# Patient Record
Sex: Male | Born: 1985 | Race: Black or African American | Hispanic: No | Marital: Single | Smoking: Current every day smoker
Health system: Southern US, Community
[De-identification: ages and names within clinical notes are randomized; demographics above are authoritative.]

## PROBLEM LIST (undated history)

## (undated) DIAGNOSIS — I10 Essential (primary) hypertension: Secondary | ICD-10-CM

## (undated) HISTORY — DX: Essential (primary) hypertension: I10

---
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
## 2004-10-22 ENCOUNTER — Emergency Department (HOSPITAL_COMMUNITY): Admission: EM | Admit: 2004-10-22 | Discharge: 2004-10-22 | Payer: Self-pay | Admitting: Emergency Medicine

## 2006-06-15 ENCOUNTER — Emergency Department (HOSPITAL_COMMUNITY): Admission: EM | Admit: 2006-06-15 | Discharge: 2006-06-15 | Payer: Self-pay | Admitting: Emergency Medicine

## 2015-10-06 ENCOUNTER — Emergency Department (HOSPITAL_COMMUNITY)
Admission: EM | Admit: 2015-10-06 | Discharge: 2015-10-07 | Disposition: A | Discharge status: Home / Self Care | Payer: BLUE CROSS/BLUE SHIELD | Attending: Emergency Medicine | Admitting: Emergency Medicine

## 2015-10-06 ENCOUNTER — Encounter (HOSPITAL_COMMUNITY): Payer: Self-pay | Admitting: Emergency Medicine

## 2015-10-06 DIAGNOSIS — F17210 Nicotine dependence, cigarettes, uncomplicated: Secondary | ICD-10-CM | POA: Insufficient documentation

## 2015-10-06 DIAGNOSIS — N200 Calculus of kidney: Secondary | ICD-10-CM | POA: Insufficient documentation

## 2015-10-06 MED ORDER — MORPHINE SULFATE (PF) 4 MG/ML IV SOLN
4.0000 mg | Freq: Once | INTRAVENOUS | Status: AC
  Administered 2015-10-06: 4 mg via INTRAVENOUS
  Filled 2015-10-06: qty 1

## 2015-10-06 MED ORDER — SODIUM CHLORIDE 0.9 % IV BOLUS (SEPSIS)
1000.0000 mL | Freq: Once | INTRAVENOUS | Status: AC
  Administered 2015-10-06: 1000 mL via INTRAVENOUS

## 2015-10-06 MED ORDER — ONDANSETRON HCL 4 MG/2ML IJ SOLN
4.0000 mg | Freq: Once | INTRAMUSCULAR | Status: AC
  Administered 2015-10-06: 4 mg via INTRAVENOUS
  Filled 2015-10-06: qty 2

## 2015-10-06 NOTE — ED Notes (Signed)
Patient has right flank pain that travels around to abdomen, started about 2 hours ago.

## 2015-10-06 NOTE — ED Provider Notes (Signed)
CSN: [id number]     Arrival date & time 10/06/15  2244 History   First MD Initiated Contact with Patient 10/06/15 2249     Chief Complaint  Patient presents with  . Flank Pain  . Abdominal Pain     (Consider location/radiation/quality/duration/timing/severity/associated sxs/prior Treatment) HPI   Norwood LevoJeffrey A Offutt is a 30 y.o. male who presents to the Emergency Department complaining of sudden onset of right flank pain approximately two hours prior to ED arrival.  He describes a sharp pain to the flank that radiates into his mid abdomen.  Pain began after eating and voiding.  Nothing has made the pain better or worse.  He denies fever, dysuria, vomiting, diarrhea, recent illness or hx of kidney stones.     History reviewed. No pertinent past medical history. History reviewed. No pertinent past surgical history. Family History  Problem Relation Age of Onset  . Cancer Father    Social History  Substance Use Topics  . Smoking status: Current Every Day Smoker -- 1.00 packs/day for 10 years    Types: Cigarettes  . Smokeless tobacco: Never Used  . Alcohol Use: Yes     Comment: Ocassional    Review of Systems  Constitutional: Negative for fever, chills and appetite change.  HENT: Negative for sore throat.   Respiratory: Negative for chest tightness and shortness of breath.   Gastrointestinal: Positive for nausea and abdominal pain. Negative for vomiting, diarrhea and constipation.  Genitourinary: Negative for dysuria, frequency, hematuria, discharge, difficulty urinating, penile pain and testicular pain.  Musculoskeletal: Positive for back pain. Negative for neck pain.  Skin: Negative for rash.  Neurological: Negative for dizziness, weakness and numbness.  All other systems reviewed and are negative.     Allergies  Review of patient's allergies indicates not on file.  Home Medications   Prior to Admission medications   Not on File   BP 152/98 mmHg  Pulse 54  Temp(Src)  98.5 F (36.9 C) (Oral)  Resp 16  Ht 5\' 9"  (1.753 m)  Wt 74.844 kg  BMI 24.36 kg/m2  SpO2 100% Physical Exam  Constitutional: He is oriented to person, place, and time. He appears well-developed and well-nourished. No distress.  HENT:  Head: Atraumatic.  Mouth/Throat: Oropharynx is clear and moist.  Cardiovascular: Normal rate and regular rhythm.   Pulmonary/Chest: Effort normal and breath sounds normal.  Abdominal: Soft. Normal appearance. He exhibits no distension. There is no tenderness. There is no rebound, no guarding and no CVA tenderness.  Pt indicates pain to the right flank area.  No CVA tenderness on exam.  Musculoskeletal: Normal range of motion.  Neurological: He is alert and oriented to person, place, and time.  Skin: Skin is warm. No rash noted.  Psychiatric: He has a normal mood and affect.  Nursing note and vitals reviewed.   ED Course  Procedures (including critical care time) Labs Review Labs Reviewed  URINALYSIS, ROUTINE W REFLEX MICROSCOPIC (NOT [facility]) - Abnormal; Notable for the following:    pH 8.5 (*)    Hgb urine dipstick MODERATE (*)    Protein, ur 30 (*)    All other components within normal limits  CBC WITH DIFFERENTIAL/PLATELET - Abnormal; Notable for the following:    WBC 14.3 (*)    Neutro Abs 11.9 (*)    All other components within normal limits  COMPREHENSIVE METABOLIC PANEL - Abnormal; Notable for the following:    Potassium 3.2 (*)    Glucose, Bld 123 (*)  Total Protein 8.4 (*)    All other components within normal limits  URINE MICROSCOPIC-ADD ON - Abnormal; Notable for the following:    Squamous Epithelial / LPF 0-5 (*)    All other components within normal limits  URINE CULTURE  LIPASE, BLOOD    Imaging Review Ct Renal Stone Study  10/07/2015  CLINICAL DATA:  Acute onset of right flank pain.  Initial encounter. EXAM: CT ABDOMEN AND PELVIS WITHOUT CONTRAST TECHNIQUE: Multidetector CT imaging of the abdomen and pelvis was  performed following the standard protocol without IV contrast. COMPARISON:  None. FINDINGS: The visualized lung bases are clear. The liver and spleen are unremarkable in appearance. The gallbladder is within normal limits. The pancreas and adrenal glands are unremarkable. Minimal right-sided hydronephrosis is noted, with right-sided perinephric stranding. There appears to be an obstructing 3 mm stone at the distal right ureter, 1 cm above the right vesicoureteral junction. No free fluid is identified. The small bowel is unremarkable in appearance. The stomach is within normal limits. No acute vascular abnormalities are seen. The appendix is normal in caliber, without evidence of appendicitis. The colon is unremarkable in appearance. The bladder is relatively decompressed and not well assessed. The prostate remains normal in size. No inguinal lymphadenopathy is seen. No acute osseous abnormalities are identified. IMPRESSION: Minimal right-sided hydronephrosis, with an obstructing 3 mm stone at the distal right ureter, 1 cm above the right vesicoureteral junction. Electronically Signed   By: Roanna Raider M.D.   On: 10/07/2015 00:39   I have personally reviewed and evaluated these images and lab results as part of my medical decision-making.   EKG Interpretation None      MDM   Final diagnoses:  Right kidney stone    Pt is well appearing.  Vitals stable.  Sudden onset of right flank pain this evening.  CT shows 3mm obstructing stone.  No fever, dysuria, vomiting or CVA tenderness.   Pt feeling better after pain medication and anti-emetic.  Urine strainer dispensed, rx for flomax, percocet and zofran.  Referral info given for urology.  Agrees to ER return if needed.   Pauline Aus, PA-C 10/07/15 1841  Bethann Berkshire, MD 10/08/15 1650

## 2015-10-07 ENCOUNTER — Emergency Department (HOSPITAL_COMMUNITY): Payer: BLUE CROSS/BLUE SHIELD

## 2015-10-07 LAB — CBC WITH DIFFERENTIAL/PLATELET
Basophils Absolute: 0 10*3/uL (ref 0.0–0.1)
Basophils Relative: 0 %
EOS PCT: 0 %
Eosinophils Absolute: 0.1 10*3/uL (ref 0.0–0.7)
HEMATOCRIT: 43.4 % (ref 39.0–52.0)
HEMOGLOBIN: 15.1 g/dL (ref 13.0–17.0)
LYMPHS ABS: 1.5 10*3/uL (ref 0.7–4.0)
LYMPHS PCT: 11 %
MCH: 33.2 pg (ref 26.0–34.0)
MCHC: 34.8 g/dL (ref 30.0–36.0)
MCV: 95.4 fL (ref 78.0–100.0)
Monocytes Absolute: 0.9 10*3/uL (ref 0.1–1.0)
Monocytes Relative: 6 %
NEUTROS ABS: 11.9 10*3/uL — AB (ref 1.7–7.7)
NEUTROS PCT: 83 %
Platelets: 163 10*3/uL (ref 150–400)
RBC: 4.55 MIL/uL (ref 4.22–5.81)
RDW: 12.1 % (ref 11.5–15.5)
WBC: 14.3 10*3/uL — AB (ref 4.0–10.5)

## 2015-10-07 LAB — URINALYSIS, ROUTINE W REFLEX MICROSCOPIC
BILIRUBIN URINE: NEGATIVE
Glucose, UA: NEGATIVE mg/dL
Ketones, ur: NEGATIVE mg/dL
Leukocytes, UA: NEGATIVE
Nitrite: NEGATIVE
Protein, ur: 30 mg/dL — AB
SPECIFIC GRAVITY, URINE: 1.015 (ref 1.005–1.030)
pH: 8.5 — ABNORMAL HIGH (ref 5.0–8.0)

## 2015-10-07 LAB — COMPREHENSIVE METABOLIC PANEL
ALK PHOS: 60 U/L (ref 38–126)
ALT: 33 U/L (ref 17–63)
AST: 33 U/L (ref 15–41)
Albumin: 5 g/dL (ref 3.5–5.0)
Anion gap: 9 (ref 5–15)
BILIRUBIN TOTAL: 0.9 mg/dL (ref 0.3–1.2)
BUN: 16 mg/dL (ref 6–20)
CALCIUM: 9.4 mg/dL (ref 8.9–10.3)
CO2: 25 mmol/L (ref 22–32)
CREATININE: 1.03 mg/dL (ref 0.61–1.24)
Chloride: 105 mmol/L (ref 101–111)
Glucose, Bld: 123 mg/dL — ABNORMAL HIGH (ref 65–99)
Potassium: 3.2 mmol/L — ABNORMAL LOW (ref 3.5–5.1)
Sodium: 139 mmol/L (ref 135–145)
Total Protein: 8.4 g/dL — ABNORMAL HIGH (ref 6.5–8.1)

## 2015-10-07 LAB — LIPASE, BLOOD: LIPASE: 37 U/L (ref 11–51)

## 2015-10-07 LAB — URINE MICROSCOPIC-ADD ON
Bacteria, UA: NONE SEEN
WBC, UA: NONE SEEN WBC/hpf (ref 0–5)

## 2015-10-07 MED ORDER — TAMSULOSIN HCL 0.4 MG PO CAPS: 0.4000 mg | ORAL_CAPSULE | Freq: Every day | ORAL | Status: DC

## 2015-10-07 MED ORDER — ONDANSETRON HCL 4 MG PO TABS: 4.0000 mg | ORAL_TABLET | Freq: Four times a day (QID) | ORAL | Status: DC

## 2015-10-07 MED ORDER — OXYCODONE-ACETAMINOPHEN 5-325 MG PO TABS: 1.0000 | ORAL_TABLET | ORAL | Status: DC | PRN

## 2015-10-07 MED ORDER — KETOROLAC TROMETHAMINE 30 MG/ML IJ SOLN
30.0000 mg | Freq: Once | INTRAMUSCULAR | Status: AC
  Administered 2015-10-07: 30 mg via INTRAVENOUS
  Filled 2015-10-07: qty 1

## 2015-10-07 NOTE — ED Notes (Signed)
Patient was given a prepackage of Percocet 5-325 mg quantity six and given instruction on use, patient verbally understands.

## 2015-10-07 NOTE — Discharge Instructions (Signed)
Kidney Stones °Kidney stones (urolithiasis) are deposits that form inside your kidneys. The intense pain is caused by the stone moving through the urinary tract. When the stone moves, the ureter goes into spasm around the stone. The stone is usually passed in the urine.  °CAUSES  °· A disorder that makes certain neck glands produce too much parathyroid hormone (primary hyperparathyroidism). °· A buildup of uric acid crystals, similar to gout in your joints. °· Narrowing (stricture) of the ureter. °· A kidney obstruction present at birth (congenital obstruction). °· Previous surgery on the kidney or ureters. °· Numerous kidney infections. °SYMPTOMS  °· Feeling sick to your stomach (nauseous). °· Throwing up (vomiting). °· Blood in the urine (hematuria). °· Pain that usually spreads (radiates) to the groin. °· Frequency or urgency of urination. °DIAGNOSIS  °· Taking a history and physical exam. °· Blood or urine tests. °· CT scan. °· Occasionally, an examination of the inside of the urinary bladder (cystoscopy) is performed. °TREATMENT  °· Observation. °· Increasing your fluid intake. °· Extracorporeal shock wave lithotripsy--This is a noninvasive procedure that uses shock waves to break up kidney stones. °· Surgery may be needed if you have severe pain or persistent obstruction. There are various surgical procedures. Most of the procedures are performed with the use of small instruments. Only small incisions are needed to accommodate these instruments, so recovery time is minimized. °The size, location, and chemical composition are all important variables that will determine the proper choice of action for you. Talk to your health care provider to better understand your situation so that you will minimize the risk of injury to yourself and your kidney.  °HOME CARE INSTRUCTIONS  °· Drink enough water and fluids to keep your urine clear or pale yellow. This will help you to pass the stone or stone fragments. °· Strain  all urine through the provided strainer. Keep all particulate matter and stones for your health care provider to see. The stone causing the pain may be as small as a grain of salt. It is very important to use the strainer each and every time you pass your urine. The collection of your stone will allow your health care provider to analyze it and verify that a stone has actually passed. The stone analysis will often identify what you can do to reduce the incidence of recurrences. °· Only take over-the-counter or prescription medicines for pain, discomfort, or fever as directed by your health care provider. °· Keep all follow-up visits as told by your health care provider. This is important. °· Get follow-up X-rays if required. The absence of pain does not always mean that the stone has passed. It may have only stopped moving. If the urine remains completely obstructed, it can cause loss of kidney function or even complete destruction of the kidney. It is your responsibility to make sure X-rays and follow-ups are completed. Ultrasounds of the kidney can show blockages and the status of the kidney. Ultrasounds are not associated with any radiation and can be performed easily in a matter of minutes. °· Make changes to your daily diet as told by your health care provider. You may be told to: °¨ Limit the amount of salt that you eat. °¨ Eat 5 or more servings of fruits and vegetables each day. °¨ Limit the amount of meat, poultry, fish, and eggs that you eat. °· Collect a 24-hour urine sample as told by your health care provider. You may need to collect another urine sample every 6-12   months. °SEEK MEDICAL CARE IF: °· You experience pain that is progressive and unresponsive to any pain medicine you have been prescribed. °SEEK IMMEDIATE MEDICAL CARE IF:  °· Pain cannot be controlled with the prescribed medicine. °· You have a fever or shaking chills. °· The severity or intensity of pain increases over 18 hours and is not  relieved by pain medicine. °· You develop a new onset of abdominal pain. °· You feel faint or pass out. °· You are unable to urinate. °  °This information is not intended to replace advice given to you by your health care provider. Make sure you discuss any questions you have with your health care provider. °  °Document Released: 06/15/2005 Document Revised: 03/06/2015 Document Reviewed: 11/16/2012 °Elsevier Interactive Patient Education ©2016 [employer] ° °

## 2015-10-08 LAB — URINE CULTURE: CULTURE: NO GROWTH

## 2015-10-15 MED FILL — Oxycodone w/ Acetaminophen Tab 5-325 MG: ORAL | Qty: 6 | Status: AC

## 2015-10-15 MED FILL — Hydrocodone-Acetaminophen Tab 5-325 MG: ORAL | Qty: 6 | Status: CN

## 2017-01-18 IMAGING — CT CT RENAL STONE PROTOCOL
3 of 4 series · 7 of 46 positions shown, 13 images · non-contrast
Comparison: None.

CLINICAL DATA: Acute onset of right flank pain.  Initial encounter.

EXAM:
CT ABDOMEN AND PELVIS WITHOUT CONTRAST
TECHNIQUE: Multidetector CT imaging of the abdomen and pelvis was performed
following the standard protocol without IV contrast.

[Series 3: lung 5.0 b60f · axial · 0.64mm/px · z∈[-38,+2]mm · 3 of 18 slices shown, 7 images]
[im 5/18  soft-tissue]
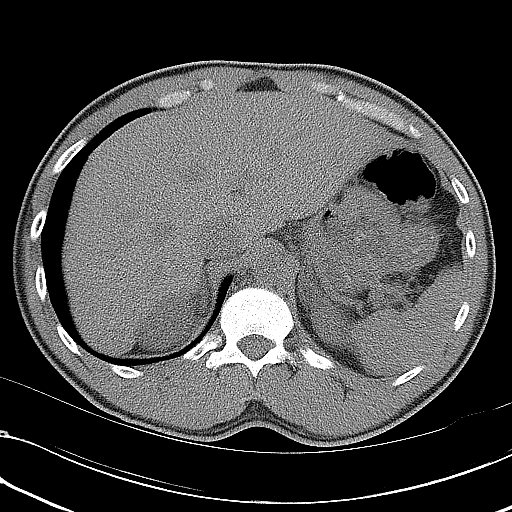
[im 5/18  lung]
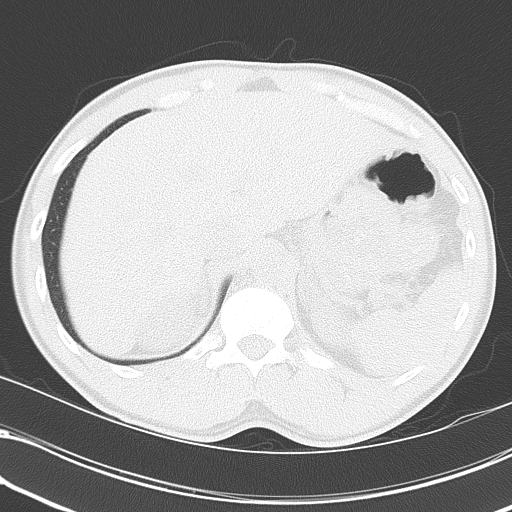
[im 5/18  bone]
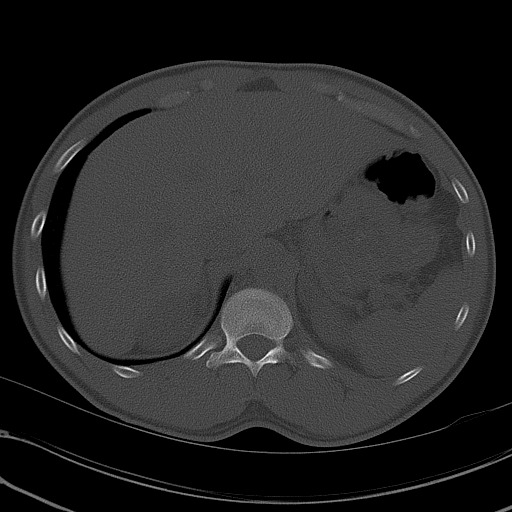
[im 9/18  soft-tissue]
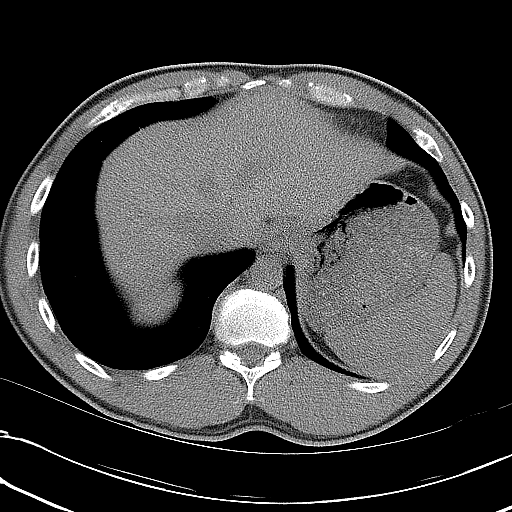
[im 9/18  lung]
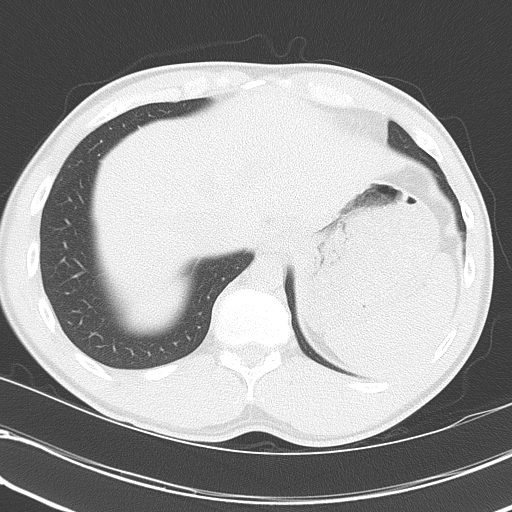
[im 13/18  soft-tissue]
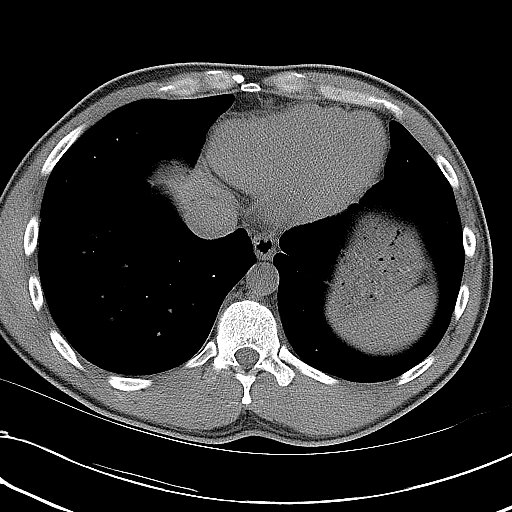
[im 13/18  lung]
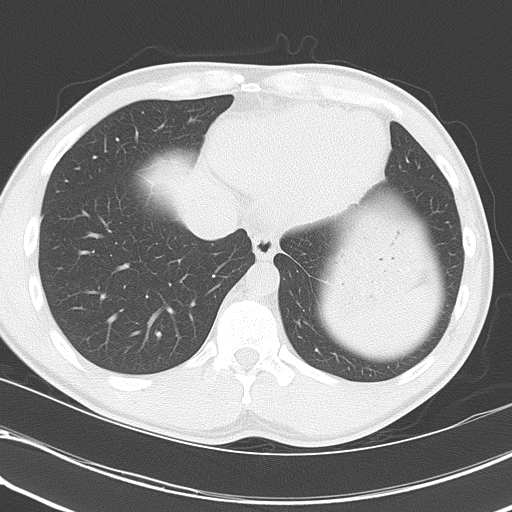

[Series 4: mpr coronal (id) · coronal · 0.62mm/px · 3 of 76 slices shown, 4 images]
[im 26/76  soft-tissue]
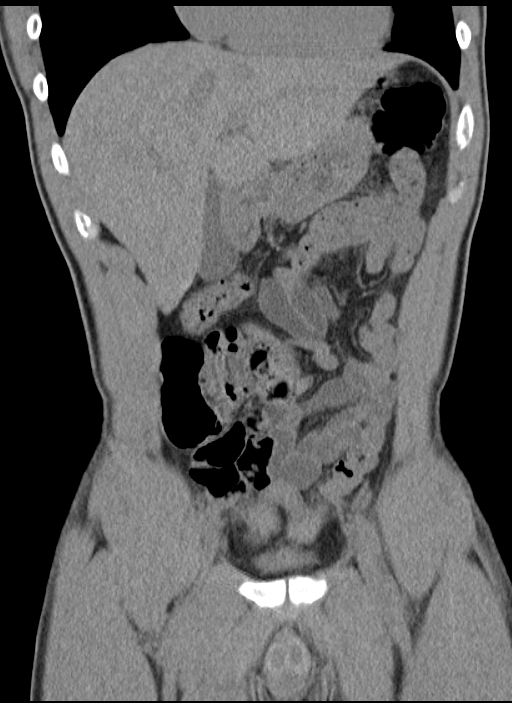
[im 34/76  soft-tissue]
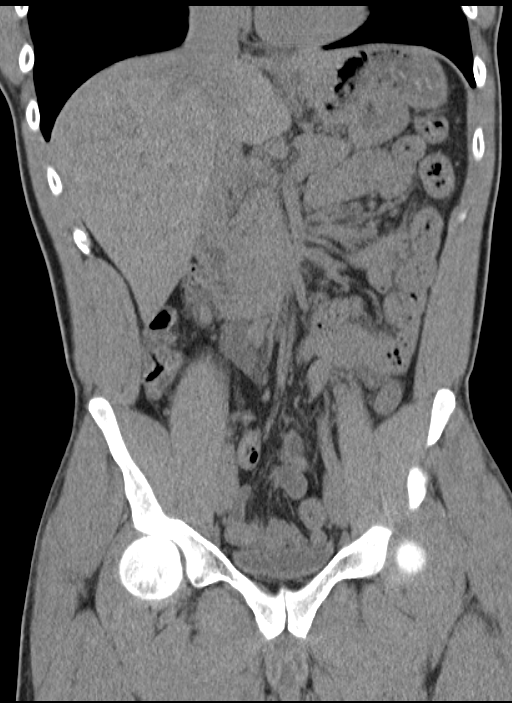
[im 34/76  bone]
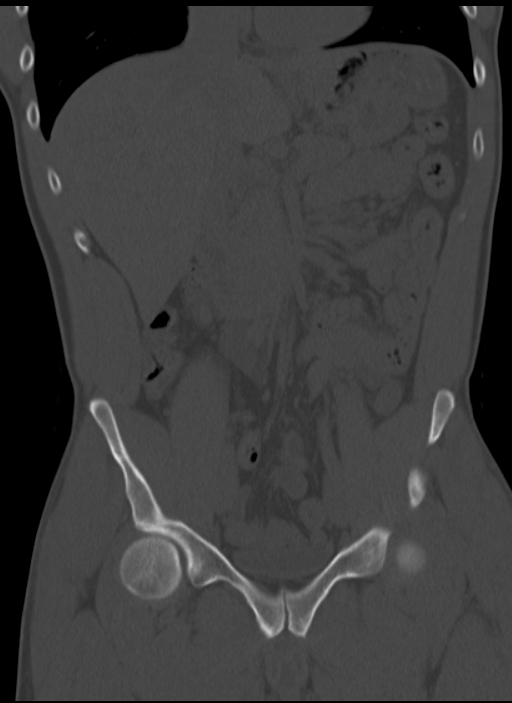
[im 42/76  soft-tissue]
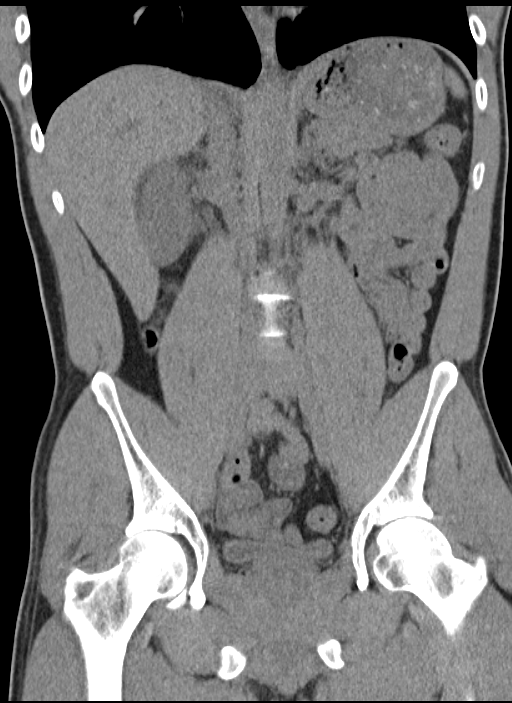

[Series 5: mpr sagittal (id) · sagittal · 0.51mm/px · 1 of 100 slices shown, 2 images]
[im 34/100  soft-tissue]
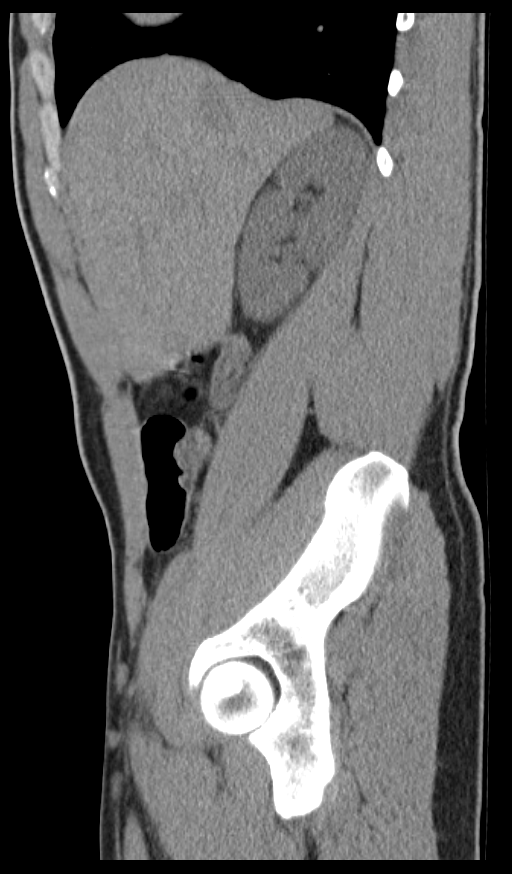
[im 34/100  bone]
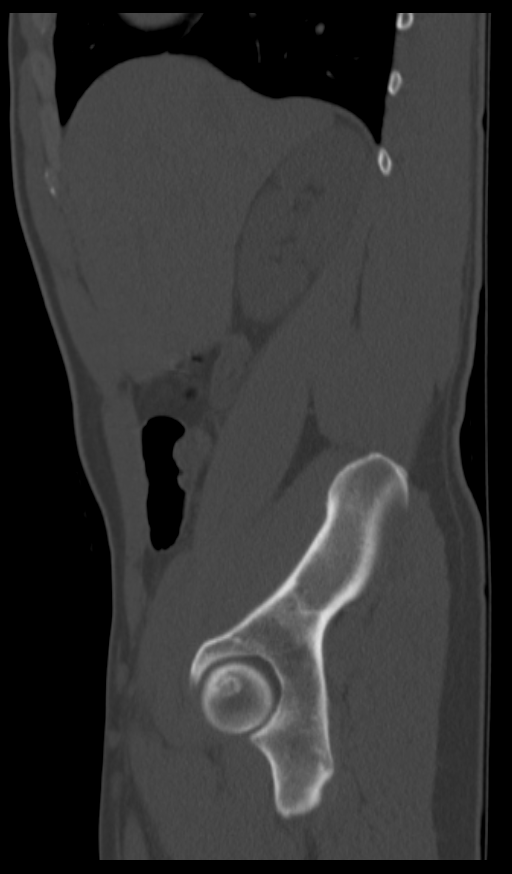

[7 of 46 positions shown; findings below may reference images not displayed]

FINDINGS: The visualized lung bases are clear.

The liver and spleen are unremarkable in appearance. The gallbladder
is within normal limits. The pancreas and adrenal glands are
unremarkable.

Minimal right-sided hydronephrosis is noted, with right-sided
perinephric stranding. There appears to be an obstructing 3 mm stone
at the distal right ureter, 1 cm above the right vesicoureteral
junction.

No free fluid is identified. The small bowel is unremarkable in
appearance. The stomach is within normal limits. No acute vascular
abnormalities are seen.

The appendix is normal in caliber, without evidence of appendicitis.
The colon is unremarkable in appearance.

The bladder is relatively decompressed and not well assessed. The
prostate remains normal in size. No inguinal lymphadenopathy is
seen.

No acute osseous abnormalities are identified.
IMPRESSION: Minimal right-sided hydronephrosis, with an obstructing 3 mm stone
at the distal right ureter, 1 cm above the right vesicoureteral
junction.

## 2023-01-07 ENCOUNTER — Ambulatory Visit
Admission: EM | Admit: 2023-01-07 | Discharge: 2023-01-07 | Disposition: A | Discharge status: Home / Self Care | Payer: BLUE CROSS/BLUE SHIELD | Attending: Nurse Practitioner | Admitting: Nurse Practitioner

## 2023-01-07 ENCOUNTER — Encounter: Payer: Self-pay | Admitting: Emergency Medicine

## 2023-01-07 DIAGNOSIS — R21 Rash and other nonspecific skin eruption: Secondary | ICD-10-CM

## 2023-01-07 MED ORDER — TRIAMCINOLONE ACETONIDE 0.1 % EX CREA: 1.0000 | TOPICAL_CREAM | Freq: Two times a day (BID) | CUTANEOUS | 0 refills | Status: DC

## 2023-01-07 MED ORDER — VALACYCLOVIR HCL 1 G PO TABS: 1000.0000 mg | ORAL_TABLET | Freq: Three times a day (TID) | ORAL | 0 refills | Status: DC

## 2023-01-07 MED ORDER — PREDNISONE 20 MG PO TABS: 40.0000 mg | ORAL_TABLET | Freq: Every day | ORAL | 0 refills | Status: AC

## 2023-01-07 NOTE — ED Provider Notes (Signed)
[facility]    CSN: [id number] Arrival date & time: 01/07/23  1707      History   Chief Complaint No chief complaint on file.   HPI Dwayne Turner is a 37 y.o. male.   The history is provided by the patient.   The patient presents for complaints of rash to the left back, left chest, and left abdomen.  Patient states symptoms have been present for the past 3 to 4 days.  Patient states that the rash is "itchy".  He states that it does become uncomfortable when he gets hot or sweaty.  Patient denies fever, chills, exposure to new soaps, foods, medications, lotions, or detergents.  Patient reports that he did have a history of chickenpox as a child.  History reviewed. No pertinent past medical history.  There are no problems to display for this patient.   History reviewed. No pertinent surgical history.     Home Medications    Prior to Admission medications   Medication Sig Start Date End Date Taking? Authorizing Provider  predniSONE (DELTASONE) 20 MG tablet Take 2 tablets (40 mg total) by mouth daily with breakfast for 5 days. 01/07/23 01/12/23 Yes Urijah Arko-Warren, Sadie Haber, NP  triamcinolone cream (KENALOG) 0.1 % Apply 1 Application topically 2 (two) times daily. 01/07/23  Yes Aliena Ghrist-Warren, Sadie Haber, NP  valACYclovir (VALTREX) 1000 MG tablet Take 1 tablet (1,000 mg total) by mouth 3 (three) times daily. 01/07/23  Yes Loryn Haacke-Warren, Sadie Haber, NP    Family History Family History  Problem Relation Age of Onset   Cancer Father     Social History Social History   Tobacco Use   Smoking status: Every Day    Current packs/day: 1.00    Average packs/day: 1 pack/day for 10.0 years (10.0 ttl pk-yrs)    Types: Cigarettes   Smokeless tobacco: Never  Vaping Use   Vaping status: Never Used  Substance Use Topics   Alcohol use: Yes    Comment: Ocassional   Drug use: No     Allergies   Patient has no known allergies.   Review of Systems Review of  Systems Per HPI  Physical Exam Triage Vital Signs ED Triage Vitals  Encounter Vitals Group     BP 01/07/23 1710 (!) 157/106     Systolic BP Percentile --      Diastolic BP Percentile --      Pulse Rate 01/07/23 1710 64     Resp 01/07/23 1710 18     Temp 01/07/23 1710 97.6 F (36.4 C)     Temp Source 01/07/23 1710 Oral     SpO2 01/07/23 1710 99 %     Weight --      Height --      Head Circumference --      Peak Flow --      Pain Score 01/07/23 1711 0     Pain Loc --      Pain Education --      Exclude from Growth Chart --    No data found.  Updated Vital Signs BP (!) 157/106 (BP Location: Right Arm)   Pulse 64   Temp 97.6 F (36.4 C) (Oral)   Resp 18   SpO2 99%   Visual Acuity Right Eye Distance:   Left Eye Distance:   Bilateral Distance:    Right Eye Near:   Left Eye Near:    Bilateral Near:     Physical Exam Vitals and nursing note reviewed.  Constitutional:      General: He is not in acute distress.    Appearance: Normal appearance.  HENT:     Head: Normocephalic.  Eyes:     Extraocular Movements: Extraocular movements intact.     Pupils: Pupils are equal, round, and reactive to light.  Pulmonary:     Effort: Pulmonary effort is normal.  Skin:    General: Skin is warm and dry.     Findings: Rash present.     Comments: Erythematous patches noted on the left abdomen, left chest, and left mid back.  Patches with small blistering's that are fluid-filled.  Slight oozing noted to blistering noted to the left abdomen.  The areas are warm to palpation.  There is no fluctuance present.  Neurological:     General: No focal deficit present.     Mental Status: He is alert and oriented to person, place, and time.  Psychiatric:        Mood and Affect: Mood normal.        Behavior: Behavior normal.      UC Treatments / Results  Labs (all labs ordered are listed, but only abnormal results are displayed) Labs Reviewed - No data to  display  EKG   Radiology No results found.  Procedures Procedures (including critical care time)  Medications Ordered in UC Medications - No data to display  Initial Impression / Assessment and Plan / UC Course  I have reviewed the triage vital signs and the nursing notes.  Pertinent labs & imaging results that were available during my care of the patient were reviewed by me and considered in my medical decision making (see chart for details).  The patient is well-appearing, he is in no acute distress, vital signs are stable.  Symptoms appear to be consistent with a shingles rash.  Valacyclovir 1 g 3 times daily was prescribed along with prednisone 40 mg to help with inflammation and itching, and triamcinolone cream 0.1% to apply topically for itching.  Supportive care recommendations were provided and discussed with the patient to include cool compresses to the affected area to help with pain or discomfort, covering the areas if they begin to ooze, and over-the-counter analgesics for pain or discomfort.  Patient is in agreement with this plan of care and verbalizes understanding.  All questions were answered.  Patient stable for discharge.    Final Clinical Impressions(s) / UC Diagnoses   Final diagnoses:  Rash and nonspecific skin eruption     Discharge Instructions      Your symptoms appear to be consistent with shingles. Take medication as prescribed. May take over-the-counter Tylenol as needed for pain, fever, general discomfort. For itching, you may take over-the-counter Zyrtec during the daytime and Benadryl at nighttime as needed. Increase fluids and allow for plenty of rest. May cover the areas on your chest with cool cloths to help with itching, pain, or discomfort. Avoid hot baths or showers while symptoms persist.  Recommend lukewarm baths. Do not scratch or irritate the areas while symptoms persist. If the areas begin to ooze or blister, keep them  covered. Follow-up as needed.      ED Prescriptions     Medication Sig Dispense Auth. Provider   valACYclovir (VALTREX) 1000 MG tablet Take 1 tablet (1,000 mg total) by mouth 3 (three) times daily. 21 tablet Yohanna Tow-Warren, Sadie Haber, NP   triamcinolone cream (KENALOG) 0.1 % Apply 1 Application topically 2 (two) times daily. 45 g Brevon Dewald-Warren, Sadie Haber, NP  predniSONE (DELTASONE) 20 MG tablet Take 2 tablets (40 mg total) by mouth daily with breakfast for 5 days. 10 tablet Kaylob Wallen-Warren, Sadie Haber, NP      PDMP not reviewed this encounter.   Abran Cantor, NP 01/07/23 1725

## 2023-01-07 NOTE — Discharge Instructions (Signed)
Your symptoms appear to be consistent with shingles. Take medication as prescribed. May take over-the-counter Tylenol as needed for pain, fever, general discomfort. For itching, you may take over-the-counter Zyrtec during the daytime and Benadryl at nighttime as needed. Increase fluids and allow for plenty of rest. May cover the areas on your chest with cool cloths to help with itching, pain, or discomfort. Avoid hot baths or showers while symptoms persist.  Recommend lukewarm baths. Do not scratch or irritate the areas while symptoms persist. If the areas begin to ooze or blister, keep them covered. Follow-up as needed.

## 2023-01-07 NOTE — ED Triage Notes (Signed)
Rash on left flank, abd and back x 3 days.  States rash itches.

## 2023-02-11 ENCOUNTER — Encounter: Payer: Self-pay | Admitting: Internal Medicine

## 2023-02-11 ENCOUNTER — Ambulatory Visit (INDEPENDENT_AMBULATORY_CARE_PROVIDER_SITE_OTHER): Payer: BC Managed Care – PPO | Admitting: Internal Medicine

## 2023-02-11 VITALS — BP 159/98 | HR 78 | Ht 72.0 in | Wt 178.8 lb

## 2023-02-11 DIAGNOSIS — Z131 Encounter for screening for diabetes mellitus: Secondary | ICD-10-CM

## 2023-02-11 DIAGNOSIS — Z13220 Encounter for screening for lipoid disorders: Secondary | ICD-10-CM

## 2023-02-11 DIAGNOSIS — Z1329 Encounter for screening for other suspected endocrine disorder: Secondary | ICD-10-CM

## 2023-02-11 DIAGNOSIS — I10 Essential (primary) hypertension: Secondary | ICD-10-CM

## 2023-02-11 DIAGNOSIS — Z1159 Encounter for screening for other viral diseases: Secondary | ICD-10-CM

## 2023-02-11 DIAGNOSIS — Z114 Encounter for screening for human immunodeficiency virus [HIV]: Secondary | ICD-10-CM

## 2023-02-11 DIAGNOSIS — Z8042 Family history of malignant neoplasm of prostate: Secondary | ICD-10-CM | POA: Diagnosis not present

## 2023-02-11 DIAGNOSIS — Z1321 Encounter for screening for nutritional disorder: Secondary | ICD-10-CM

## 2023-02-11 MED ORDER — AMLODIPINE BESYLATE 5 MG PO TABS: 5.0000 mg | ORAL_TABLET | Freq: Every day | ORAL | 2 refills | Status: DC

## 2023-02-11 NOTE — Assessment & Plan Note (Signed)
He reports today that his father died of prostate cancer.  He is asymptomatic currently. -PSA with basic labs

## 2023-02-11 NOTE — Progress Notes (Signed)
New Patient Office Visit  Subjective    Patient ID: Dwayne Turner, male    DOB: June 22, 1986  Age: 37 y.o. MRN: [id number]  CC:  Chief Complaint  Patient presents with   Establish Care    HPI Dwayne Turner presents to establish care.  He is a 37 year old male with no significant past medical history.  Not taking any medications regularly.  He has not had a PCP in over 10 years. Dwayne Turner reports feeling well today.  He is asymptomatic currently.  His acute concern is elevated blood pressure readings.  He presented to urgent care on 7/11 and his blood pressure was elevated at that time, 156/106.  He has been checking his blood pressure at home in the interim and has had numerous elevated readings.  His blood pressure remains elevated today, 166/111 initially and 159/98 on repeat.  He endorses current tobacco use, smoking less than 0.5 packs/day.  He endorses social alcohol and marijuana use.  His family medical history is significant for HTN and prostate cancer in his father.  Acute concerns, chronic medical conditions, and outstanding preventative care items discussed today are individually addressed in A/P below.   Outpatient Encounter Medications as of 02/11/2023  Medication Sig   amLODipine (NORVASC) 5 MG tablet Take 1 tablet (5 mg total) by mouth daily.   [DISCONTINUED] triamcinolone cream (KENALOG) 0.1 % Apply 1 Application topically 2 (two) times daily. (Patient not taking: Reported on 02/11/2023)   [DISCONTINUED] valACYclovir (VALTREX) 1000 MG tablet Take 1 tablet (1,000 mg total) by mouth 3 (three) times daily. (Patient not taking: Reported on 02/11/2023)   No facility-administered encounter medications on file as of 02/11/2023.    History reviewed. No pertinent past medical history.  History reviewed. No pertinent surgical history.  Family History  Problem Relation Age of Onset   Cancer Father     Social History   Socioeconomic History   Marital status: Single     Spouse name: Not on file   Number of children: Not on file   Years of education: Not on file   Highest education level: Not on file  Occupational History   Not on file  Tobacco Use   Smoking status: Every Day    Current packs/day: 1.00    Average packs/day: 1 pack/day for 10.0 years (10.0 ttl pk-yrs)    Types: Cigarettes   Smokeless tobacco: Never  Vaping Use   Vaping status: Never Used  Substance and Sexual Activity   Alcohol use: Yes    Comment: Ocassional   Drug use: No   Sexual activity: Not on file  Other Topics Concern   Not on file  Social History Narrative   Not on file   Social Determinants of Health   Financial Resource Strain: Not on file  Food Insecurity: Not on file  Transportation Needs: Not on file  Physical Activity: Not on file  Stress: Not on file  Social Connections: Not on file  Intimate Partner Violence: Not on file   Review of Systems  Constitutional:  Negative for chills and fever.  HENT:  Negative for sore throat.   Respiratory:  Negative for cough and shortness of breath.   Cardiovascular:  Negative for chest pain, palpitations and leg swelling.  Gastrointestinal:  Negative for abdominal pain, blood in stool, constipation, diarrhea, nausea and vomiting.  Genitourinary:  Negative for dysuria and hematuria.  Musculoskeletal:  Negative for myalgias.  Skin:  Negative for itching and rash.  Neurological:  Negative for dizziness and headaches.  Psychiatric/Behavioral:  Negative for depression and suicidal ideas.    Objective    BP (!) 159/98   Pulse 78   Ht 6' (1.829 m)   Wt 178 lb 12.8 oz (81.1 kg)   SpO2 96%   BMI 24.25 kg/m   Physical Exam Vitals reviewed.  Constitutional:      General: He is not in acute distress.    Appearance: Normal appearance. He is not ill-appearing.  HENT:     Head: Normocephalic and atraumatic.     Right Ear: External ear normal.     Left Ear: External ear normal.     Nose: Nose normal. No congestion or  rhinorrhea.     Mouth/Throat:     Mouth: Mucous membranes are moist.     Pharynx: Oropharynx is clear.  Eyes:     General: No scleral icterus.    Extraocular Movements: Extraocular movements intact.     Conjunctiva/sclera: Conjunctivae normal.     Pupils: Pupils are equal, round, and reactive to light.  Cardiovascular:     Rate and Rhythm: Normal rate and regular rhythm.     Pulses: Normal pulses.     Heart sounds: Normal heart sounds. No murmur heard. Pulmonary:     Effort: Pulmonary effort is normal.     Breath sounds: Normal breath sounds. No wheezing, rhonchi or rales.  Abdominal:     General: Abdomen is flat. Bowel sounds are normal. There is no distension.     Palpations: Abdomen is soft.     Tenderness: There is no abdominal tenderness.  Musculoskeletal:        General: No swelling or deformity. Normal range of motion.     Cervical back: Normal range of motion.  Skin:    General: Skin is warm and dry.     Capillary Refill: Capillary refill takes less than 2 seconds.  Neurological:     General: No focal deficit present.     Mental Status: He is alert and oriented to person, place, and time.     Motor: No weakness.  Psychiatric:        Mood and Affect: Mood normal.        Behavior: Behavior normal.        Thought Content: Thought content normal.    Assessment & Plan:   Problem List Items Addressed This Visit       Essential hypertension - Primary    New diagnosis, meeting criteria with elevated blood pressure readings on multiple occasions.  BP remains elevated today, 166/111 initially and 159/98 on repeat.  BP was elevated at a recent urgent care visit and has been elevated multiple times at home in the interim. -Start amlodipine 5 mg daily -He will follow-up in 2 weeks for for a nurse visit for BP check      Family history of prostate cancer in father    He reports today that his father died of prostate cancer.  He is asymptomatic currently. -PSA with basic  labs      Return in about 1 year (around 02/11/2024) for CPE.   Billie Lade, MD

## 2023-02-11 NOTE — Assessment & Plan Note (Signed)
New diagnosis, meeting criteria with elevated blood pressure readings on multiple occasions.  BP remains elevated today, 166/111 initially and 159/98 on repeat.  BP was elevated at a recent urgent care visit and has been elevated multiple times at home in the interim. -Start amlodipine 5 mg daily -He will follow-up in 2 weeks for for a nurse visit for BP check

## 2023-02-11 NOTE — Patient Instructions (Signed)
It was a pleasure to see you today.  Thank you for giving [place] the opportunity to be involved in your care.  Below is a brief recap of your visit and next steps.  We will plan to see you again in 1 year.  Summary You have established care today We will check basic labs Start amlodipine 5 mg daily for hypertension Follow up in 1 year for annual exam Nurse visit in 2 weeks for BP check. Will give tetanus vaccine at that time as well.

## 2023-02-12 LAB — VITAMIN D 25 HYDROXY (VIT D DEFICIENCY, FRACTURES): Vit D, 25-Hydroxy: 23.6 ng/mL — ABNORMAL LOW (ref 30.0–100.0)

## 2023-02-12 LAB — CBC WITH DIFFERENTIAL/PLATELET
Basophils Absolute: 0 10*3/uL (ref 0.0–0.2)
Basos: 0 %
EOS (ABSOLUTE): 0.1 10*3/uL (ref 0.0–0.4)
Eos: 1 %
Hematocrit: 42.8 % (ref 37.5–51.0)
Hemoglobin: 14.6 g/dL (ref 13.0–17.7)
Immature Grans (Abs): 0 10*3/uL (ref 0.0–0.1)
Immature Granulocytes: 0 %
Lymphocytes Absolute: 1.3 10*3/uL (ref 0.7–3.1)
Lymphs: 24 %
MCH: 33.1 pg — ABNORMAL HIGH (ref 26.6–33.0)
MCHC: 34.1 g/dL (ref 31.5–35.7)
MCV: 97 fL (ref 79–97)
Monocytes Absolute: 0.5 10*3/uL (ref 0.1–0.9)
Monocytes: 9 %
Neutrophils Absolute: 3.6 10*3/uL (ref 1.4–7.0)
Neutrophils: 66 %
Platelets: 182 10*3/uL (ref 150–450)
RBC: 4.41 x10E6/uL (ref 4.14–5.80)
RDW: 13.1 % (ref 11.6–15.4)
WBC: 5.4 10*3/uL (ref 3.4–10.8)

## 2023-02-12 LAB — LIPID PANEL
Chol/HDL Ratio: 3.1 ratio (ref 0.0–5.0)
Cholesterol, Total: 142 mg/dL (ref 100–199)
HDL: 46 mg/dL (ref >39)
LDL Chol Calc (NIH): 79 mg/dL (ref 0–99)
Triglycerides: 91 mg/dL (ref 0–149)
VLDL Cholesterol Cal: 17 mg/dL (ref 5–40)

## 2023-02-12 LAB — TSH+FREE T4
Free T4: 0.98 ng/dL (ref 0.82–1.77)
TSH: 1.66 u[IU]/mL (ref 0.450–4.500)

## 2023-02-12 LAB — CMP14+EGFR
ALT: 31 IU/L (ref 0–44)
AST: 27 IU/L (ref 0–40)
Albumin: 4.7 g/dL (ref 4.1–5.1)
Alkaline Phosphatase: 86 IU/L (ref 44–121)
BUN/Creatinine Ratio: 12 (ref 9–20)
BUN: 12 mg/dL (ref 6–20)
Bilirubin Total: 0.5 mg/dL (ref 0.0–1.2)
CO2: 24 mmol/L (ref 20–29)
Calcium: 9.5 mg/dL (ref 8.7–10.2)
Chloride: 103 mmol/L (ref 96–106)
Creatinine, Ser: 0.98 mg/dL (ref 0.76–1.27)
Globulin, Total: 3 g/dL (ref 1.5–4.5)
Glucose: 93 mg/dL (ref 70–99)
Potassium: 4.3 mmol/L (ref 3.5–5.2)
Sodium: 140 mmol/L (ref 134–144)
Total Protein: 7.7 g/dL (ref 6.0–8.5)
eGFR: 101 mL/min/{1.73_m2} (ref >59)

## 2023-02-12 LAB — B12 AND FOLATE PANEL
Folate: 4.4 ng/mL (ref >3.0)
Vitamin B-12: 285 pg/mL (ref 232–1245)

## 2023-02-12 LAB — HEMOGLOBIN A1C
Est. average glucose Bld gHb Est-mCnc: 114 mg/dL
Hgb A1c MFr Bld: 5.6 % (ref 4.8–5.6)

## 2023-02-12 LAB — HCV AB W REFLEX TO QUANT PCR: HCV Ab: NONREACTIVE

## 2023-02-12 LAB — HCV INTERPRETATION

## 2023-02-12 LAB — PSA: Prostate Specific Ag, Serum: 0.7 ng/mL (ref 0.0–4.0)

## 2023-02-12 LAB — HIV ANTIBODY (ROUTINE TESTING W REFLEX): HIV Screen 4th Generation wRfx: NONREACTIVE

## 2023-02-25 ENCOUNTER — Ambulatory Visit: Payer: BC Managed Care – PPO

## 2023-03-03 ENCOUNTER — Telehealth: Payer: Self-pay

## 2023-03-03 ENCOUNTER — Ambulatory Visit (INDEPENDENT_AMBULATORY_CARE_PROVIDER_SITE_OTHER): Payer: BC Managed Care – PPO

## 2023-03-03 VITALS — BP 148/82

## 2023-03-03 DIAGNOSIS — Z23 Encounter for immunization: Secondary | ICD-10-CM | POA: Diagnosis not present

## 2023-03-03 NOTE — Telephone Encounter (Signed)
Just an FYI

## 2023-05-17 ENCOUNTER — Other Ambulatory Visit: Payer: Self-pay | Admitting: Internal Medicine

## 2023-05-17 DIAGNOSIS — I10 Essential (primary) hypertension: Secondary | ICD-10-CM

## 2023-08-23 ENCOUNTER — Other Ambulatory Visit: Payer: Self-pay | Admitting: Internal Medicine

## 2023-08-23 DIAGNOSIS — I10 Essential (primary) hypertension: Secondary | ICD-10-CM

## 2023-12-01 ENCOUNTER — Other Ambulatory Visit: Payer: Self-pay | Admitting: Internal Medicine

## 2023-12-01 DIAGNOSIS — I10 Essential (primary) hypertension: Secondary | ICD-10-CM

## 2024-02-14 ENCOUNTER — Encounter: Payer: Self-pay | Admitting: Internal Medicine

## 2024-02-29 ENCOUNTER — Other Ambulatory Visit: Payer: Self-pay

## 2024-02-29 ENCOUNTER — Other Ambulatory Visit: Payer: Self-pay | Admitting: Internal Medicine

## 2024-02-29 ENCOUNTER — Telehealth: Payer: Self-pay

## 2024-02-29 DIAGNOSIS — I10 Essential (primary) hypertension: Secondary | ICD-10-CM

## 2024-02-29 MED ORDER — AMLODIPINE BESYLATE 5 MG PO TABS: 5.0000 mg | ORAL_TABLET | Freq: Every day | ORAL | 0 refills | Status: DC

## 2024-02-29 NOTE — Telephone Encounter (Signed)
 15 day supply sent , patient aware he has to come in for appointment

## 2024-02-29 NOTE — Telephone Encounter (Signed)
 Copied from CRM [id number]. Topic: Clinical - Prescription Issue >> Feb 29, 2024  3:41 PM Emylou G wrote: Reason for CRM: Patient wants to know if he can get a few pills until his appt on Monday

## 2024-03-06 ENCOUNTER — Ambulatory Visit: Payer: Self-pay | Admitting: Internal Medicine

## 2024-03-06 ENCOUNTER — Encounter: Payer: Self-pay | Admitting: Internal Medicine

## 2024-03-06 VITALS — BP 144/88 | HR 62 | Ht 69.5 in | Wt 179.8 lb

## 2024-03-06 DIAGNOSIS — Z0001 Encounter for general adult medical examination with abnormal findings: Secondary | ICD-10-CM | POA: Diagnosis not present

## 2024-03-06 DIAGNOSIS — I10 Essential (primary) hypertension: Secondary | ICD-10-CM | POA: Diagnosis not present

## 2024-03-06 DIAGNOSIS — E559 Vitamin D deficiency, unspecified: Secondary | ICD-10-CM | POA: Diagnosis not present

## 2024-03-06 DIAGNOSIS — E782 Mixed hyperlipidemia: Secondary | ICD-10-CM | POA: Diagnosis not present

## 2024-03-06 DIAGNOSIS — R739 Hyperglycemia, unspecified: Secondary | ICD-10-CM

## 2024-03-06 DIAGNOSIS — Z2821 Immunization not carried out because of patient refusal: Secondary | ICD-10-CM

## 2024-03-06 MED ORDER — AMLODIPINE BESYLATE 10 MG PO TABS: 10.0000 mg | ORAL_TABLET | Freq: Every day | ORAL | 1 refills | Status: AC

## 2024-03-06 NOTE — Patient Instructions (Signed)
 Please start taking Amlodipine  10 mg once daily.  Please continue to follow low salt diet and perform moderate exercise/walking at least 150 mins/week.  Please get fasting blood tests done before the next visit.

## 2024-03-06 NOTE — Assessment & Plan Note (Signed)
 Physical exam as documented. Counseling done  re healthy lifestyle involving commitment to 150 minutes exercise per week, heart healthy diet, and attaining healthy weight.The importance of adequate sleep also discussed. Immunization and cancer screening needs are specifically addressed at this visit.

## 2024-03-06 NOTE — Progress Notes (Signed)
 Established Patient Office Visit  Subjective:  Patient ID: Dwayne Turner, male    DOB: 1986/01/13  Age: 38 y.o. MRN: [id number]  CC:  Chief Complaint  Patient presents with   Hypertension    Following up on blood pressure needs refills.     HPI Dwayne Turner is a 38 y.o. male with past medical history of HTN who presents for f/u of his chronic medical conditions.  HTN: His BP was elevated today.  He reports having BP around 140s/80s at home as well.  He takes amlodipine  5 mg QD currently.  Denies headache, dizziness, chest pain, dyspnea or palpitations currently.  He works at [employer].  He exercises regularly.  Past Medical History:  Diagnosis Date   Hypertension     History reviewed. No pertinent surgical history.  Family History  Problem Relation Age of Onset   Cancer Father     Social History   Socioeconomic History   Marital status: Single    Spouse name: Not on file   Number of children: Not on file   Years of education: Not on file   Highest education level: Not on file  Occupational History   Not on file  Tobacco Use   Smoking status: Every Day    Current packs/day: 1.00    Average packs/day: 1 pack/day for 10.0 years (10.0 ttl pk-yrs)    Types: Cigarettes   Smokeless tobacco: Never  Vaping Use   Vaping status: Never Used  Substance and Sexual Activity   Alcohol use: Yes    Comment: Ocassional   Drug use: No   Sexual activity: Not on file  Other Topics Concern   Not on file  Social History Narrative   Not on file   Social Drivers of Health   Financial Resource Strain: Not on file  Food Insecurity: Not on file  Transportation Needs: Not on file  Physical Activity: Not on file  Stress: Not on file  Social Connections: Not on file  Intimate Partner Violence: Not on file    Outpatient Medications Prior to Visit  Medication Sig Dispense Refill   amLODipine  (NORVASC ) 5 MG tablet Take 1 tablet (5 mg total) by mouth daily. 15 tablet  0   No facility-administered medications prior to visit.    No Known Allergies  ROS Review of Systems  Constitutional:  Negative for chills and fever.  HENT:  Negative for congestion and sore throat.   Eyes:  Negative for pain and discharge.  Respiratory:  Negative for cough and shortness of breath.   Cardiovascular:  Negative for chest pain and palpitations.  Gastrointestinal:  Negative for constipation, diarrhea, nausea and vomiting.  Endocrine: Negative for polydipsia and polyuria.  Genitourinary:  Negative for dysuria and hematuria.  Musculoskeletal:  Negative for neck pain and neck stiffness.  Skin:  Negative for rash.  Neurological:  Negative for dizziness, weakness, numbness and headaches.  Psychiatric/Behavioral:  Negative for agitation and behavioral problems.       Objective:    Physical Exam Vitals reviewed.  Constitutional:      General: He is not in acute distress.    Appearance: He is not diaphoretic.  HENT:     Head: Normocephalic and atraumatic.     Nose: Nose normal.     Mouth/Throat:     Mouth: Mucous membranes are moist.  Eyes:     General: No scleral icterus.    Extraocular Movements: Extraocular movements intact.  Cardiovascular:  Rate and Rhythm: Normal rate and regular rhythm.     Heart sounds: Normal heart sounds. No murmur heard. Pulmonary:     Breath sounds: Normal breath sounds. No wheezing or rales.  Abdominal:     Palpations: Abdomen is soft.     Tenderness: There is no abdominal tenderness.  Musculoskeletal:     Cervical back: Neck supple. No tenderness.     Right lower leg: No edema.     Left lower leg: No edema.  Skin:    General: Skin is warm.     Findings: No rash.  Neurological:     General: No focal deficit present.     Mental Status: He is alert and oriented to person, place, and time.     Cranial Nerves: No cranial nerve deficit.     Sensory: No sensory deficit.     Motor: No weakness.  Psychiatric:        Mood and  Affect: Mood normal.        Behavior: Behavior normal.     BP (!) 144/88 (BP Location: Right Arm)   Pulse 62   Ht 5' 9.5 (1.765 m)   Wt 179 lb 12.8 oz (81.6 kg)   SpO2 100%   BMI 26.17 kg/m  Wt Readings from Last 3 Encounters:  03/06/24 179 lb 12.8 oz (81.6 kg)  02/11/23 178 lb 12.8 oz (81.1 kg)  10/06/15 165 lb (74.8 kg)    Lab Results  Component Value Date   TSH 1.660 02/11/2023   Lab Results  Component Value Date   WBC 5.4 02/11/2023   HGB 14.6 02/11/2023   HCT 42.8 02/11/2023   MCV 97 02/11/2023   PLT 182 02/11/2023   Lab Results  Component Value Date   NA 140 02/11/2023   K 4.3 02/11/2023   CO2 24 02/11/2023   GLUCOSE 93 02/11/2023   BUN 12 02/11/2023   CREATININE 0.98 02/11/2023   BILITOT 0.5 02/11/2023   ALKPHOS 86 02/11/2023   AST 27 02/11/2023   ALT 31 02/11/2023   PROT 7.7 02/11/2023   ALBUMIN 4.7 02/11/2023   CALCIUM 9.5 02/11/2023   ANIONGAP 9 10/06/2015   EGFR 101 02/11/2023   Lab Results  Component Value Date   CHOL 142 02/11/2023   Lab Results  Component Value Date   HDL 46 02/11/2023   Lab Results  Component Value Date   LDLCALC 79 02/11/2023   Lab Results  Component Value Date   TRIG 91 02/11/2023   Lab Results  Component Value Date   CHOLHDL 3.1 02/11/2023   Lab Results  Component Value Date   HGBA1C 5.6 02/11/2023      Assessment & Plan:   Problem List Items Addressed This Visit       Cardiovascular and Mediastinum   Essential hypertension   BP Readings from Last 1 Encounters:  03/06/24 (!) 144/88   Uncontrolled with amlodipine  5 mg QD Increased dose of amlodipine  to 10 mg QD Counseled for compliance with the medications Advised DASH diet and moderate exercise/walking, at least 150 mins/week      Relevant Medications   amLODipine  (NORVASC ) 10 MG tablet   Other Relevant Orders   TSH   CMP14+EGFR   CBC with Differential/Platelet     Other   Encounter for general adult medical examination with abnormal  findings - Primary   Physical exam as documented. Counseling done  re healthy lifestyle involving commitment to 150 minutes exercise per week, heart healthy diet, and  attaining healthy weight.The importance of adequate sleep also discussed. Immunization and cancer screening needs are specifically addressed at this visit.      Other Visit Diagnoses       Vitamin D  deficiency       Relevant Orders   VITAMIN D  25 Hydroxy (Vit-D Deficiency, Fractures)     Mixed hyperlipidemia       Relevant Medications   amLODipine  (NORVASC ) 10 MG tablet   Other Relevant Orders   Lipid panel     Hyperglycemia       Relevant Orders   Hemoglobin A1c     Refused influenza vaccine           Meds ordered this encounter  Medications   amLODipine  (NORVASC ) 10 MG tablet    Sig: Take 1 tablet (10 mg total) by mouth daily.    Dispense:  90 tablet    Refill:  1    Follow-up: Return in about 3 months (around 06/05/2024) for HTN.    Suzzane MARLA Blanch, MD

## 2024-03-06 NOTE — Assessment & Plan Note (Signed)
 BP Readings from Last 1 Encounters:  03/06/24 (!) 144/88   Uncontrolled with amlodipine  5 mg QD Increased dose of amlodipine  to 10 mg QD Counseled for compliance with the medications Advised DASH diet and moderate exercise/walking, at least 150 mins/week

## 2024-06-19 DIAGNOSIS — E559 Vitamin D deficiency, unspecified: Secondary | ICD-10-CM | POA: Diagnosis not present

## 2024-06-19 DIAGNOSIS — E782 Mixed hyperlipidemia: Secondary | ICD-10-CM | POA: Diagnosis not present

## 2024-06-19 DIAGNOSIS — R739 Hyperglycemia, unspecified: Secondary | ICD-10-CM | POA: Diagnosis not present

## 2024-06-19 DIAGNOSIS — I10 Essential (primary) hypertension: Secondary | ICD-10-CM | POA: Diagnosis not present

## 2024-06-20 ENCOUNTER — Encounter: Payer: Self-pay | Admitting: Internal Medicine

## 2024-06-20 ENCOUNTER — Ambulatory Visit: Payer: Self-pay | Admitting: Internal Medicine

## 2024-06-20 VITALS — BP 144/86 | HR 77 | Ht 69.0 in | Wt 177.0 lb

## 2024-06-20 DIAGNOSIS — I10 Essential (primary) hypertension: Secondary | ICD-10-CM

## 2024-06-20 DIAGNOSIS — E559 Vitamin D deficiency, unspecified: Secondary | ICD-10-CM | POA: Diagnosis not present

## 2024-06-20 LAB — CMP14+EGFR
ALT: 25 IU/L (ref 0–44)
AST: 20 IU/L (ref 0–40)
Albumin: 4.7 g/dL (ref 4.1–5.1)
Alkaline Phosphatase: 88 IU/L (ref 47–123)
BUN/Creatinine Ratio: 16 (ref 9–20)
BUN: 15 mg/dL (ref 6–24)
Bilirubin Total: 0.5 mg/dL (ref 0.0–1.2)
CO2: 23 mmol/L (ref 20–29)
Calcium: 9.3 mg/dL (ref 8.7–10.2)
Chloride: 101 mmol/L (ref 96–106)
Creatinine, Ser: 0.92 mg/dL (ref 0.76–1.27)
Globulin, Total: 2.9 g/dL (ref 1.5–4.5)
Glucose: 85 mg/dL (ref 70–99)
Potassium: 3.7 mmol/L (ref 3.5–5.2)
Sodium: 138 mmol/L (ref 134–144)
Total Protein: 7.6 g/dL (ref 6.0–8.5)
eGFR: 108 mL/min/1.73

## 2024-06-20 LAB — VITAMIN D 25 HYDROXY (VIT D DEFICIENCY, FRACTURES): Vit D, 25-Hydroxy: 20.1 ng/mL — ABNORMAL LOW (ref 30.0–100.0)

## 2024-06-20 LAB — CBC WITH DIFFERENTIAL/PLATELET
Basophils Absolute: 0 x10E3/uL (ref 0.0–0.2)
Basos: 1 %
EOS (ABSOLUTE): 0.1 x10E3/uL (ref 0.0–0.4)
Eos: 2 %
Hematocrit: 44.6 % (ref 37.5–51.0)
Hemoglobin: 15 g/dL (ref 13.0–17.7)
Immature Grans (Abs): 0 x10E3/uL (ref 0.0–0.1)
Immature Granulocytes: 0 %
Lymphocytes Absolute: 1.9 x10E3/uL (ref 0.7–3.1)
Lymphs: 32 %
MCH: 32.9 pg (ref 26.6–33.0)
MCHC: 33.6 g/dL (ref 31.5–35.7)
MCV: 98 fL — ABNORMAL HIGH (ref 79–97)
Monocytes Absolute: 0.6 x10E3/uL (ref 0.1–0.9)
Monocytes: 9 %
Neutrophils Absolute: 3.4 x10E3/uL (ref 1.4–7.0)
Neutrophils: 56 %
Platelets: 235 x10E3/uL (ref 150–450)
RBC: 4.56 x10E6/uL (ref 4.14–5.80)
RDW: 13.3 % (ref 11.6–15.4)
WBC: 6 x10E3/uL (ref 3.4–10.8)

## 2024-06-20 LAB — LIPID PANEL
Chol/HDL Ratio: 2.5 ratio (ref 0.0–5.0)
Cholesterol, Total: 106 mg/dL (ref 100–199)
HDL: 43 mg/dL
LDL Chol Calc (NIH): 47 mg/dL (ref 0–99)
Triglycerides: 79 mg/dL (ref 0–149)
VLDL Cholesterol Cal: 16 mg/dL (ref 5–40)

## 2024-06-20 LAB — HEMOGLOBIN A1C
Est. average glucose Bld gHb Est-mCnc: 111 mg/dL
Hgb A1c MFr Bld: 5.5 % (ref 4.8–5.6)

## 2024-06-20 LAB — TSH: TSH: 1.1 u[IU]/mL (ref 0.450–4.500)

## 2024-06-20 MED ORDER — OLMESARTAN MEDOXOMIL 5 MG PO TABS: 5.0000 mg | ORAL_TABLET | Freq: Every day | ORAL | 5 refills | Status: AC

## 2024-06-20 NOTE — Patient Instructions (Addendum)
 Your blood tests are overall within normal limits, except low Vitamin D .  Please start taking Vitamin D  2000 IU once daily.  Please start taking Olmesartan  5 mg once daily. Please continue taking Amlodipine  10 mg.  Please follow low salt diet and perform moderate exercise/walking at least 150 mins/week.

## 2024-06-20 NOTE — Assessment & Plan Note (Addendum)
 BP Readings from Last 1 Encounters:  06/20/24 (!) 144/86   Uncontrolled with amlodipine  10 mg QD Added Olmesartan  5 mg QD Counseled for compliance with the medications Advised DASH diet and moderate exercise/walking, at least 150 mins/week

## 2024-06-20 NOTE — Progress Notes (Signed)
 "  Established Patient Office Visit  Subjective:  Patient ID: Dwayne Turner, male    DOB: 1985-12-06  Age: 38 y.o. MRN: [id number]  CC:  Chief Complaint  Patient presents with   Hypertension    Three month follow up     HPI Dwayne Turner is a 38 y.o. male with past medical history of HTN who presents for f/u of his chronic medical conditions.  HTN: His BP was elevated today.  He still reports having BP around 140s/80s at home as well.  He takes amlodipine  10 mg QD currently.  Denies headache, dizziness, chest pain, dyspnea or palpitations currently.  Blood tests were reviewed in detail with the patient.  He works at [employer].  He exercises regularly.  Past Medical History:  Diagnosis Date   Hypertension     History reviewed. No pertinent surgical history.  Family History  Problem Relation Age of Onset   Cancer Father     Social History   Socioeconomic History   Marital status: Single    Spouse name: Not on file   Number of children: Not on file   Years of education: Not on file   Highest education level: Not on file  Occupational History   Not on file  Tobacco Use   Smoking status: Every Day    Current packs/day: 1.00    Average packs/day: 1 pack/day for 10.0 years (10.0 ttl pk-yrs)    Types: Cigarettes   Smokeless tobacco: Never  Vaping Use   Vaping status: Never Used  Substance and Sexual Activity   Alcohol use: Yes    Comment: Ocassional   Drug use: No   Sexual activity: Not on file  Other Topics Concern   Not on file  Social History Narrative   Not on file   Social Drivers of Health   Tobacco Use: High Risk (06/20/2024)   Patient History    Smoking Tobacco Use: Every Day    Smokeless Tobacco Use: Never    Passive Exposure: Not on file  Financial Resource Strain: Not on file  Food Insecurity: Not on file  Transportation Needs: Not on file  Physical Activity: Not on file  Stress: Not on file  Social Connections: Not on file   Intimate Partner Violence: Not on file  Depression (PHQ2-9): Low Risk (06/20/2024)   Depression (PHQ2-9)    PHQ-2 Score: 0  Alcohol Screen: Not on file  Housing: Not on file  Utilities: Not on file  Health Literacy: Not on file    Outpatient Medications Prior to Visit  Medication Sig Dispense Refill   amLODipine  (NORVASC ) 10 MG tablet Take 1 tablet (10 mg total) by mouth daily. 90 tablet 1   No facility-administered medications prior to visit.    No Known Allergies  ROS Review of Systems  Constitutional:  Negative for chills and fever.  HENT:  Negative for congestion and sore throat.   Eyes:  Negative for pain and discharge.  Respiratory:  Negative for cough and shortness of breath.   Cardiovascular:  Negative for chest pain and palpitations.  Gastrointestinal:  Negative for constipation, diarrhea, nausea and vomiting.  Endocrine: Negative for polydipsia and polyuria.  Genitourinary:  Negative for dysuria and hematuria.  Musculoskeletal:  Negative for neck pain and neck stiffness.  Skin:  Negative for rash.  Neurological:  Negative for dizziness, weakness, numbness and headaches.  Psychiatric/Behavioral:  Negative for agitation and behavioral problems.       Objective:    Physical  Exam Vitals reviewed.  Constitutional:      General: He is not in acute distress.    Appearance: He is not diaphoretic.  HENT:     Head: Normocephalic and atraumatic.     Nose: Nose normal.     Mouth/Throat:     Mouth: Mucous membranes are moist.  Eyes:     General: No scleral icterus.    Extraocular Movements: Extraocular movements intact.  Cardiovascular:     Rate and Rhythm: Normal rate and regular rhythm.     Heart sounds: Normal heart sounds. No murmur heard. Pulmonary:     Breath sounds: Normal breath sounds. No wheezing or rales.  Musculoskeletal:     Cervical back: Neck supple. No tenderness.     Right lower leg: No edema.     Left lower leg: No edema.  Skin:     General: Skin is warm.     Findings: No rash.  Neurological:     General: No focal deficit present.     Mental Status: He is alert and oriented to person, place, and time.  Psychiatric:        Mood and Affect: Mood normal.        Behavior: Behavior normal.     BP (!) 144/86 (BP Location: Left Arm)   Pulse 77   Ht 5' 9 (1.753 m)   Wt 177 lb (80.3 kg)   SpO2 99%   BMI 26.14 kg/m  Wt Readings from Last 3 Encounters:  06/20/24 177 lb (80.3 kg)  03/06/24 179 lb 12.8 oz (81.6 kg)  02/11/23 178 lb 12.8 oz (81.1 kg)    Lab Results  Component Value Date   TSH 1.100 06/19/2024   Lab Results  Component Value Date   WBC 6.0 06/19/2024   HGB 15.0 06/19/2024   HCT 44.6 06/19/2024   MCV 98 (H) 06/19/2024   PLT 235 06/19/2024   Lab Results  Component Value Date   NA 138 06/19/2024   K 3.7 06/19/2024   CO2 23 06/19/2024   GLUCOSE 85 06/19/2024   BUN 15 06/19/2024   CREATININE 0.92 06/19/2024   BILITOT 0.5 06/19/2024   ALKPHOS 88 06/19/2024   AST 20 06/19/2024   ALT 25 06/19/2024   PROT 7.6 06/19/2024   ALBUMIN 4.7 06/19/2024   CALCIUM 9.3 06/19/2024   ANIONGAP 9 10/06/2015   EGFR 108 06/19/2024   Lab Results  Component Value Date   CHOL 106 06/19/2024   Lab Results  Component Value Date   HDL 43 06/19/2024   Lab Results  Component Value Date   LDLCALC 47 06/19/2024   Lab Results  Component Value Date   TRIG 79 06/19/2024   Lab Results  Component Value Date   CHOLHDL 2.5 06/19/2024   Lab Results  Component Value Date   HGBA1C 5.5 06/19/2024      Assessment & Plan:   Problem List Items Addressed This Visit       Cardiovascular and Mediastinum   Essential hypertension - Primary   BP Readings from Last 1 Encounters:  06/20/24 (!) 144/86   Uncontrolled with amlodipine  10 mg QD Added Olmesartan  5 mg QD Counseled for compliance with the medications Advised DASH diet and moderate exercise/walking, at least 150 mins/week      Relevant Medications    olmesartan  (BENICAR ) 5 MG tablet     Other   Vitamin D  deficiency   Last vitamin D  Lab Results  Component Value Date   VD25OH 20.1 (  L) 06/19/2024   Advised to take Vitamin D  2000 IU QD       Meds ordered this encounter  Medications   olmesartan  (BENICAR ) 5 MG tablet    Sig: Take 1 tablet (5 mg total) by mouth daily.    Dispense:  30 tablet    Refill:  5    Follow-up: Return in about 3 months (around 09/18/2024) for HTN.    Suzzane MARLA Blanch, MD "

## 2024-06-20 NOTE — Assessment & Plan Note (Signed)
 Last vitamin D  Lab Results  Component Value Date   VD25OH 20.1 (L) 06/19/2024   Advised to take Vitamin D  2000 IU QD

## 2024-06-27 ENCOUNTER — Ambulatory Visit: Payer: Self-pay

## 2024-06-27 NOTE — Telephone Encounter (Signed)
 FYI Only or Action Required?: FYI only for provider: arm rash.  Patient was last seen in primary care on 06/20/2024 by Tobie Suzzane POUR, MD.  Called Nurse Triage reporting Rash.  Symptoms began several days ago.  Interventions attempted: Nothing.  Symptoms are: stable.  Triage Disposition: Home Care  Patient/caregiver understands and will follow disposition?:     Copied from CRM #[id number]. Topic: Clinical - Red Word Triage >> Jun 27, 2024  9:38 AM Nathanel BROCKS wrote: Red Word that prompted transfer to Nurse Triage:   Pt just went on olmesartan  (BENICAR ) 5 MG tablet and seems to be having a allergic reaction  Bumps, itchy Reason for Disposition  Mild localized rash  Answer Assessment - Initial Assessment Questions Advised doesn't sound like med reaction as rash isn't widespread and no widespread itching.    1. APPEARANCE of RASH: What does the rash look like? (e.g., blisters, dry flaky skin, red spots, redness, sores)     5-6 Pinpoint red bumps on rt arm  5. ONSET: When did the rash start?      Few days ago after starting new benicar  6. ITCHING: Does the rash itch? If Yes, ask: How bad is the itch?  (Scale 0-10; or none, mild, moderate, severe)     Mild itching 7. PAIN: Does the rash hurt? If Yes, ask: How bad is the pain?  (Scale 0-10; or none, mild, moderate, severe)     no 8. OTHER SYMPTOMS: Do you have any other symptoms? (e.g., fever)     no  Protocols used: Rash or Redness - Localized-A-AH

## 2024-07-21 ENCOUNTER — Ambulatory Visit
Admission: EM | Admit: 2024-07-21 | Discharge: 2024-07-21 | Disposition: A | Discharge status: Home / Self Care | Attending: Physician Assistant | Admitting: Physician Assistant

## 2024-07-21 DIAGNOSIS — B349 Viral infection, unspecified: Secondary | ICD-10-CM

## 2024-07-21 DIAGNOSIS — R112 Nausea with vomiting, unspecified: Secondary | ICD-10-CM

## 2024-07-21 LAB — POC COVID19/FLU A&B COMBO
Covid Antigen, POC: NEGATIVE
Influenza A Antigen, POC: NEGATIVE
Influenza B Antigen, POC: NEGATIVE

## 2024-07-21 MED ORDER — ONDANSETRON 4 MG PO TBDP: 4.0000 mg | ORAL_TABLET | Freq: Three times a day (TID) | ORAL | 0 refills | Status: AC | PRN

## 2024-07-21 NOTE — ED Provider Notes (Signed)
 " [facility]    CSN: [id number] Arrival date & time: 07/21/24  9170      History   Chief Complaint No chief complaint on file.   HPI Dwayne Turner is a 39 y.o. male.   Patient complains of nausea vomiting and diarrhea.  Patient is concerned that he has influenza.  Patient states that he did have the flu last year.  Patient reports he has been experiencing some chills and some bodyaches.  The history is provided by the patient. No language interpreter was used.    Past Medical History:  Diagnosis Date   Hypertension     Patient Active Problem List   Diagnosis Date Noted   Vitamin D  deficiency 06/20/2024   Encounter for general adult medical examination with abnormal findings 03/06/2024   Essential hypertension 02/11/2023   Family history of prostate cancer in father 02/11/2023    History reviewed. No pertinent surgical history.     Home Medications    Prior to Admission medications  Medication Sig Start Date End Date Taking? Authorizing Provider  amLODipine  (NORVASC ) 10 MG tablet Take 1 tablet (10 mg total) by mouth daily. 03/06/24   Tobie Suzzane POUR, MD  olmesartan  (BENICAR ) 5 MG tablet Take 1 tablet (5 mg total) by mouth daily. 06/20/24   Tobie Suzzane POUR, MD    Family History Family History  Problem Relation Age of Onset   Cancer Father     Social History Social History[1]   Allergies   Patient has no known allergies.   Review of Systems Review of Systems  Gastrointestinal:  Positive for diarrhea, nausea and vomiting.  All other systems reviewed and are negative.    Physical Exam Triage Vital Signs ED Triage Vitals  Encounter Vitals Group     BP 07/21/24 0906 125/84     Girls Systolic BP Percentile --      Girls Diastolic BP Percentile --      Boys Systolic BP Percentile --      Boys Diastolic BP Percentile --      Pulse Rate 07/21/24 0906 80     Resp 07/21/24 0906 20     Temp 07/21/24 0906 99.6 F (37.6 C)     Temp Source  07/21/24 0906 Oral     SpO2 07/21/24 0906 97 %     Weight --      Height --      Head Circumference --      Peak Flow --      Pain Score 07/21/24 0908 7     Pain Loc --      Pain Education --      Exclude from Growth Chart --    No data found.  Updated Vital Signs BP 125/84 (BP Location: Right Arm)   Pulse 80   Temp 99.6 F (37.6 C) (Oral)   Resp 20   SpO2 97%   Visual Acuity Right Eye Distance:   Left Eye Distance:   Bilateral Distance:    Right Eye Near:   Left Eye Near:    Bilateral Near:     Physical Exam Vitals and nursing note reviewed.  Constitutional:      Appearance: He is well-developed.  HENT:     Head: Normocephalic.  Cardiovascular:     Rate and Rhythm: Normal rate.  Pulmonary:     Effort: Pulmonary effort is normal.  Abdominal:     General: There is no distension.  Musculoskeletal:  General: Normal range of motion.     Cervical back: Normal range of motion.  Skin:    General: Skin is warm.  Neurological:     General: No focal deficit present.     Mental Status: He is alert and oriented to person, place, and time.      UC Treatments / Results  Labs (all labs ordered are listed, but only abnormal results are displayed) Labs Reviewed  POC COVID19/FLU A&B COMBO - Normal    EKG   Radiology No results found.  Procedures Procedures (including critical care time)  Medications Ordered in UC Medications - No data to display  Initial Impression / Assessment and Plan / UC Course  I have reviewed the triage vital signs and the nursing notes.  Pertinent labs & imaging results that were available during my care of the patient were reviewed by me and considered in my medical decision making (see chart for details).     Influenza and COVID are negative Final Clinical Impressions(s) / UC Diagnoses   Final diagnoses:  Viral illness  Nausea and vomiting, unspecified vomiting type     Discharge Instructions      Return if any  problems    ED Prescriptions     Medication Sig Dispense Auth. Provider   ondansetron  (ZOFRAN -ODT) 4 MG disintegrating tablet Take 1 tablet (4 mg total) by mouth every 8 (eight) hours as needed for nausea or vomiting. 20 tablet Jahad Old K, PA-C      PDMP not reviewed this encounter. An After Visit Summary was printed and given to the patient.         [1]  Social History Tobacco Use   Smoking status: Every Day    Current packs/day: 1.00    Average packs/day: 1 pack/day for 10.0 years (10.0 ttl pk-yrs)    Types: Cigarettes   Smokeless tobacco: Never  Vaping Use   Vaping status: Never Used  Substance Use Topics   Alcohol use: Yes    Comment: Ocassional   Drug use: No     Flint Sonny POUR, PA-C 07/21/24 1005  "

## 2024-07-21 NOTE — ED Triage Notes (Signed)
 Pt reports he has n/v, chills, body aches x 1 day

## 2024-07-21 NOTE — Discharge Instructions (Addendum)
 Return if any problems.

## 2024-09-19 ENCOUNTER — Ambulatory Visit: Payer: Self-pay | Admitting: Internal Medicine
